# Patient Record
Sex: Female | Born: 1975 | Hispanic: No | Marital: Married | Smoking: Never smoker
Health system: Southern US, Community
[De-identification: ages and names within clinical notes are randomized; demographics above are authoritative.]

## PROBLEM LIST (undated history)

## (undated) DIAGNOSIS — Z9889 Other specified postprocedural states: Secondary | ICD-10-CM

## (undated) DIAGNOSIS — F909 Attention-deficit hyperactivity disorder, unspecified type: Secondary | ICD-10-CM

## (undated) DIAGNOSIS — R112 Nausea with vomiting, unspecified: Secondary | ICD-10-CM

## (undated) DIAGNOSIS — R519 Headache, unspecified: Secondary | ICD-10-CM

## (undated) DIAGNOSIS — K219 Gastro-esophageal reflux disease without esophagitis: Secondary | ICD-10-CM

---

## 1998-01-22 HISTORY — PX: URETHRAL STRICTURE DILATATION: SHX477

## 2006-12-04 ENCOUNTER — Other Ambulatory Visit: Admission: RE | Admit: 2006-12-04 | Discharge: 2006-12-04 | Payer: Self-pay | Admitting: Gynecology

## 2007-05-14 ENCOUNTER — Other Ambulatory Visit: Admission: RE | Admit: 2007-05-14 | Discharge: 2007-05-14 | Payer: Self-pay | Admitting: Gynecology

## 2007-08-12 ENCOUNTER — Other Ambulatory Visit: Admission: RE | Admit: 2007-08-12 | Discharge: 2007-08-12 | Payer: Self-pay | Admitting: Gynecology

## 2010-02-06 ENCOUNTER — Other Ambulatory Visit (HOSPITAL_COMMUNITY)
Admission: RE | Admit: 2010-02-06 | Discharge: 2010-02-06 | Disposition: A | Discharge status: Home / Self Care | Payer: PRIVATE HEALTH INSURANCE | Source: Ambulatory Visit | Attending: Internal Medicine | Admitting: Internal Medicine

## 2010-02-06 ENCOUNTER — Other Ambulatory Visit (HOSPITAL_COMMUNITY): Admission: RE | Admit: 2010-02-06 | Payer: Self-pay | Source: Ambulatory Visit | Admitting: Internal Medicine

## 2010-02-06 ENCOUNTER — Other Ambulatory Visit
Admission: RE | Admit: 2010-02-06 | Discharge: 2010-02-06 | Payer: Self-pay | Source: Home / Self Care | Admitting: Internal Medicine

## 2010-02-06 DIAGNOSIS — R87810 Cervical high risk human papillomavirus (HPV) DNA test positive: Secondary | ICD-10-CM | POA: Insufficient documentation

## 2012-06-23 ENCOUNTER — Other Ambulatory Visit (HOSPITAL_COMMUNITY)
Admission: RE | Admit: 2012-06-23 | Discharge: 2012-06-23 | Disposition: A | Discharge status: Home / Self Care | Payer: BC Managed Care – PPO | Source: Ambulatory Visit | Attending: Internal Medicine | Admitting: Internal Medicine

## 2012-06-23 DIAGNOSIS — Z01419 Encounter for gynecological examination (general) (routine) without abnormal findings: Secondary | ICD-10-CM | POA: Insufficient documentation

## 2012-06-23 DIAGNOSIS — R87810 Cervical high risk human papillomavirus (HPV) DNA test positive: Secondary | ICD-10-CM | POA: Insufficient documentation

## 2012-06-23 DIAGNOSIS — Z1151 Encounter for screening for human papillomavirus (HPV): Secondary | ICD-10-CM | POA: Insufficient documentation

## 2013-06-29 ENCOUNTER — Other Ambulatory Visit (HOSPITAL_COMMUNITY)
Admission: RE | Admit: 2013-06-29 | Discharge: 2013-06-29 | Disposition: A | Discharge status: Home / Self Care | Payer: BC Managed Care – PPO | Source: Ambulatory Visit | Attending: Internal Medicine | Admitting: Internal Medicine

## 2013-06-29 DIAGNOSIS — Z01419 Encounter for gynecological examination (general) (routine) without abnormal findings: Secondary | ICD-10-CM | POA: Insufficient documentation

## 2014-09-14 ENCOUNTER — Other Ambulatory Visit (HOSPITAL_COMMUNITY)
Admission: RE | Admit: 2014-09-14 | Discharge: 2014-09-14 | Disposition: A | Discharge status: Home / Self Care | Payer: BC Managed Care – PPO | Source: Ambulatory Visit | Attending: Internal Medicine | Admitting: Internal Medicine

## 2014-09-14 DIAGNOSIS — Z01419 Encounter for gynecological examination (general) (routine) without abnormal findings: Secondary | ICD-10-CM | POA: Insufficient documentation

## 2015-11-19 ENCOUNTER — Telehealth: Payer: PRIVATE HEALTH INSURANCE | Admitting: Nurse Practitioner

## 2015-11-19 DIAGNOSIS — J0101 Acute recurrent maxillary sinusitis: Secondary | ICD-10-CM

## 2015-11-19 MED ORDER — CIPROFLOXACIN HCL 500 MG PO TABS: 500.0000 mg | ORAL_TABLET | Freq: Two times a day (BID) | ORAL | 0 refills | Status: DC

## 2015-11-19 NOTE — Progress Notes (Signed)
We are sorry that you are not feeling well.  Here is how we plan to help!  Based on what you have shared with me it looks like you have sinusitis.  Sinusitis is inflammation and infection in the sinus cavities of the head.  Based on your presentation I believe you most likely have Acute Bacterial Sinusitis.  This is an infection caused by bacteria and is treated with antibiotics. I have prescribed cipro 500mg  1 po 2x a day. You may use an oral decongestant such as Mucinex D or if you have glaucoma or high blood pressure use plain Mucinex. Saline nasal spray help and can safely be used as often as needed for congestion.  If you develop worsening sinus pain, fever or notice severe headache and vision changes, or if symptoms are not better after completion of antibiotic, please schedule an appointment with a health care provider.    Sinus infections are not as easily transmitted as other respiratory infection, however we still recommend that you avoid close contact with loved ones, especially the very young and elderly.  Remember to wash your hands thoroughly throughout the day as this is the number one way to prevent the spread of infection!  Home Care:  Only take medications as instructed by your medical team.  Complete the entire course of an antibiotic.  Do not take these medications with alcohol.  A steam or ultrasonic humidifier can help congestion.  You can place a towel over your head and breathe in the steam from hot water coming from a faucet.  Avoid close contacts especially the very young and the elderly.  Cover your mouth when you cough or sneeze.  Always remember to wash your hands.  Get Help Right Away If:  You develop worsening fever or sinus pain.  You develop a severe head ache or visual changes.  Your symptoms persist after you have completed your treatment plan.  Make sure you  Understand these instructions.  Will watch your condition.  Will get help right away if  you are not doing well or get worse.  Your e-visit answers were reviewed by a board certified advanced clinical practitioner to complete your personal care plan.  Depending on the condition, your plan could have included both over the counter or prescription medications.  If there is a problem please reply  once you have received a response from your provider.  Your safety is important to us.  If you have drug allergies check your prescription carefully.    You can use MyChart to ask questions about today's visit, request a non-urgent call back, or ask for a work or school excuse for 24 hours related to this e-Visit. If it has been greater than 24 hours you will need to follow up with your provider, or enter a new e-Visit to address those concerns.  You will get an e-mail in the next two days asking about your experience.  I hope that your e-visit has been valuable and will speed your recovery. Thank you for using e-visits.

## 2016-02-25 ENCOUNTER — Telehealth: Payer: PRIVATE HEALTH INSURANCE | Admitting: Nurse Practitioner

## 2016-02-25 DIAGNOSIS — J111 Influenza due to unidentified influenza virus with other respiratory manifestations: Secondary | ICD-10-CM

## 2016-02-25 MED ORDER — OSELTAMIVIR PHOSPHATE 75 MG PO CAPS: 75.0000 mg | ORAL_CAPSULE | Freq: Two times a day (BID) | ORAL | 0 refills | Status: DC

## 2016-02-25 NOTE — Progress Notes (Signed)

## 2016-06-28 ENCOUNTER — Telehealth: Payer: PRIVATE HEALTH INSURANCE | Admitting: Nurse Practitioner

## 2016-06-28 DIAGNOSIS — J019 Acute sinusitis, unspecified: Secondary | ICD-10-CM

## 2016-06-28 MED ORDER — FLUTICASONE PROPIONATE 50 MCG/ACT NA SUSP: 2.0000 | Freq: Every day | NASAL | 0 refills | Status: DC

## 2016-06-28 MED ORDER — DOXYCYCLINE HYCLATE 100 MG PO TABS: 100.0000 mg | ORAL_TABLET | Freq: Two times a day (BID) | ORAL | 0 refills | Status: AC

## 2016-06-28 NOTE — Progress Notes (Signed)
We are sorry that you are not feeling well.  Here is how we plan to help!  Based on what you have shared with me it looks like you have sinusitis.  Sinusitis is inflammation and infection in the sinus cavities of the head.  Based on your presentation I believe you most likely have Acute Bacterial Sinusitis.This is an infection most likely caused by a bacteria.  You may use an oral decongestant such as Mucinex D or if you have glaucoma or high blood pressure use plain Mucinex. Saline nasal spray help and can safely be used as often as needed for congestion, I have prescribed: Fluticasone nasal spray two sprays in each nostril twice a day for ten days and Doxycycline 100mg  twice daily for 10 days.  Some authorities believe that zinc sprays or the use of Echinacea may shorten the course of your symptoms.  Sinus infections are not as easily transmitted as other respiratory infection, however we still recommend that you avoid close contact with loved ones, especially the very young and elderly.  Remember to wash your hands thoroughly throughout the day as this is the number one way to prevent the spread of infection!  Home Care:  Only take medications as instructed by your medical team.  Complete the entire course of an antibiotic.  Do not take these medications with alcohol.  A steam or ultrasonic humidifier can help congestion.  You can place a towel over your head and breathe in the steam from hot water coming from a faucet.  Avoid close contacts especially the very young and the elderly.  Cover your mouth when you cough or sneeze.  Always remember to wash your hands.  Get Help Right Away If:  You develop worsening fever or sinus pain.  You develop a severe head ache or visual changes.  Your symptoms persist after you have completed your treatment plan.  Make sure you  Understand these instructions.  Will watch your condition.  Will get help right away if you are not doing well or  get worse.  Your e-visit answers were reviewed by a board certified advanced clinical practitioner to complete your personal care plan.  Depending on the condition, your plan could have included both over the counter or prescription medications.  If there is a problem please reply  once you have received a response from your provider.  Your safety is important to us.  If you have drug allergies check your prescription carefully.    You can use MyChart to ask questions about today's visit, request a non-urgent call back, or ask for a work or school excuse for 24 hours related to this e-Visit. If it has been greater than 24 hours you will need to follow up with your provider, or enter a new e-Visit to address those concerns.  You will get an e-mail in the next two days asking about your experience.  I hope that your e-visit has been valuable and will speed your recovery. Thank you for using e-visits.

## 2016-08-31 ENCOUNTER — Telehealth: Payer: PRIVATE HEALTH INSURANCE | Admitting: Family

## 2016-08-31 DIAGNOSIS — J028 Acute pharyngitis due to other specified organisms: Secondary | ICD-10-CM

## 2016-08-31 DIAGNOSIS — B9689 Other specified bacterial agents as the cause of diseases classified elsewhere: Secondary | ICD-10-CM

## 2016-08-31 MED ORDER — BENZONATATE 100 MG PO CAPS: 100.0000 mg | ORAL_CAPSULE | Freq: Three times a day (TID) | ORAL | 0 refills | Status: DC | PRN

## 2016-08-31 MED ORDER — AZITHROMYCIN 250 MG PO TABS: ORAL_TABLET | ORAL | 0 refills | Status: DC

## 2016-08-31 NOTE — Progress Notes (Signed)
Thank you for the details you put in the comment boxes. Those details really help us take better care of you. Please stop using the nasal flush and consider switching to saline nasal spray to avoid worsening your infection with the flush.  We are sorry that you are not feeling well.  Here is how we plan to help!  Based on your presentation I believe you most likely have A cough due to bacteria.  When patients have a fever and a productive cough with a change in color or increased sputum production, we are concerned about bacterial bronchitis.  If left untreated it can progress to pneumonia.  If your symptoms do not improve with your treatment plan it is important that you contact your provider.   I have prescribed Azithromyin 250 mg: two tables now and then one tablet daily for 4 additonal days    In addition you may use A non-prescription cough medication called Mucinex DM: take 2 tablets every 12 hours. and A prescription cough medication called Tessalon Perles 100mg . You may take 1-2 capsules every 8 hours as needed for your cough.   From your responses in the eVisit questionnaire you describe inflammation in the upper respiratory tract which is causing a significant cough.  This is commonly called Bronchitis and has four common causes:    Allergies  Viral Infections  Acid Reflux  Bacterial Infection Allergies, viruses and acid reflux are treated by controlling symptoms or eliminating the cause. An example might be a cough caused by taking certain blood pressure medications. You stop the cough by changing the medication. Another example might be a cough caused by acid reflux. Controlling the reflux helps control the cough.  USE OF BRONCHODILATOR ("RESCUE") INHALERS: There is a risk from using your bronchodilator too frequently.  The risk is that over-reliance on a medication which only relaxes the muscles surrounding the breathing tubes can reduce the effectiveness of medications prescribed  to reduce swelling and congestion of the tubes themselves.  Although you feel brief relief from the bronchodilator inhaler, your asthma may actually be worsening with the tubes becoming more swollen and filled with mucus.  This can delay other crucial treatments, such as oral steroid medications. If you need to use a bronchodilator inhaler daily, several times per day, you should discuss this with your provider.  There are probably better treatments that could be used to keep your asthma under control.     HOME CARE . Only take medications as instructed by your medical team. . Complete the entire course of an antibiotic. . Drink plenty of fluids and get plenty of rest. . Avoid close contacts especially the very young and the elderly . Cover your mouth if you cough or cough into your sleeve. . Always remember to wash your hands . A steam or ultrasonic humidifier can help congestion.   GET HELP RIGHT AWAY IF: . You develop worsening fever. . You become short of breath . You cough up blood. . Your symptoms persist after you have completed your treatment plan MAKE SURE YOU   Understand these instructions.  Will watch your condition.  Will get help right away if you are not doing well or get worse.  Your e-visit answers were reviewed by a board certified advanced clinical practitioner to complete your personal care plan.  Depending on the condition, your plan could have included both over the counter or prescription medications. If there is a problem please reply  once you have received a  response from your provider. Your safety is important to [place].  If you have drug allergies check your prescription carefully.    You can use MyChart to ask questions about today's visit, request a non-urgent call back, or ask for a work or school excuse for 24 hours related to this e-Visit. If it has been greater than 24 hours you will need to follow up with your provider, or enter a new e-Visit to address  those concerns. You will get an e-mail in the next two days asking about your experience.  I hope that your e-visit has been valuable and will speed your recovery. Thank you for using e-visits.

## 2016-10-10 ENCOUNTER — Telehealth: Payer: PRIVATE HEALTH INSURANCE | Admitting: Family

## 2016-10-10 DIAGNOSIS — R399 Unspecified symptoms and signs involving the genitourinary system: Secondary | ICD-10-CM

## 2016-10-10 MED ORDER — NITROFURANTOIN MONOHYD MACRO 100 MG PO CAPS: 100.0000 mg | ORAL_CAPSULE | Freq: Two times a day (BID) | ORAL | 0 refills | Status: DC

## 2016-10-10 NOTE — Progress Notes (Signed)

## 2017-01-23 ENCOUNTER — Telehealth: Payer: PRIVATE HEALTH INSURANCE | Admitting: Family

## 2017-01-23 DIAGNOSIS — J019 Acute sinusitis, unspecified: Secondary | ICD-10-CM

## 2017-01-23 MED ORDER — AMOXICILLIN-POT CLAVULANATE 875-125 MG PO TABS: 1.0000 | ORAL_TABLET | Freq: Two times a day (BID) | ORAL | 0 refills | Status: DC

## 2017-01-23 NOTE — Progress Notes (Signed)

## 2017-06-14 ENCOUNTER — Telehealth: Payer: PRIVATE HEALTH INSURANCE | Admitting: Family Medicine

## 2017-06-14 DIAGNOSIS — J329 Chronic sinusitis, unspecified: Secondary | ICD-10-CM

## 2017-06-14 MED ORDER — AMOXICILLIN-POT CLAVULANATE 875-125 MG PO TABS: 1.0000 | ORAL_TABLET | Freq: Two times a day (BID) | ORAL | 0 refills | Status: AC

## 2017-06-14 NOTE — Progress Notes (Signed)

## 2017-10-25 ENCOUNTER — Telehealth: Payer: PRIVATE HEALTH INSURANCE | Admitting: Family

## 2017-10-25 DIAGNOSIS — N390 Urinary tract infection, site not specified: Secondary | ICD-10-CM

## 2017-10-25 MED ORDER — CEPHALEXIN 500 MG PO CAPS: 500.0000 mg | ORAL_CAPSULE | Freq: Two times a day (BID) | ORAL | 0 refills | Status: DC

## 2017-10-25 MED ORDER — FLUCONAZOLE 150 MG PO TABS: 150.0000 mg | ORAL_TABLET | Freq: Once | ORAL | 0 refills | Status: AC

## 2017-10-25 NOTE — Progress Notes (Signed)

## 2017-11-29 ENCOUNTER — Ambulatory Visit: Payer: BC Managed Care – PPO | Admitting: Pulmonary Disease

## 2017-11-29 ENCOUNTER — Encounter: Payer: Self-pay | Admitting: Pulmonary Disease

## 2017-11-29 VITALS — BP 112/78 | HR 78

## 2017-11-29 DIAGNOSIS — R0602 Shortness of breath: Secondary | ICD-10-CM | POA: Diagnosis not present

## 2017-11-29 LAB — NITRIC OXIDE

## 2017-11-29 MED ORDER — ALBUTEROL SULFATE HFA 108 (90 BASE) MCG/ACT IN AERS: 2.0000 | INHALATION_SPRAY | Freq: Four times a day (QID) | RESPIRATORY_TRACT | 6 refills | Status: DC | PRN

## 2017-11-29 NOTE — Patient Instructions (Signed)
Shortness of breath and coughing Possible exercise-induced asthma  Moderate probability of significant sleep disordered breathing  We will set you up for home sleep study  Albuterol 2 puffs q. 15 to 30 minutes prior to activity  Continue medications for reflux Behavioral modifications to help reflux symptoms  I will see you back in the office in about 3 months

## 2017-11-29 NOTE — Progress Notes (Signed)
Katherine [name]    [id number]    1975/07/31  Primary Care Physician:Pharr, Zollie Beckers, MD  Referring Physician: Merri Brunette, MD [address] [place], [place] [address]  Chief complaint:  History of daytime sleepiness, snoring  HPI:  Patient with a history of daytime sleepiness, snoring Over 50 pound weight gain in the last couple years Shortness of breath with activity  She gets short of breath with some activity Occasionally able to go to activity without shortness of breath She does have a history of occasional wheezes  History of reflux History of cough which is worse at night   Outpatient Encounter Medications as of 11/29/2017  Medication Sig  . cetirizine (ZYRTEC ALLERGY) 10 MG tablet Take 10 mg by mouth daily.  . drospirenone-ethinyl estradiol (YASMIN,ZARAH,SYEDA) 3-0.03 MG tablet TAKE 1 TABLET BY MOUTH EVERY DAY  . esomeprazole (NEXIUM) 20 MG capsule Take 20 mg by mouth daily at 12 noon.  . fluticasone (FLONASE) 50 MCG/ACT nasal spray Place 2 sprays into both nostrils daily.  . meloxicam (MOBIC) 7.5 MG tablet meloxicam 7.5 mg tablet  . Multiple Vitamins-Minerals (MULTIVITAMIN WITH MINERALS) tablet Take 1 tablet by mouth daily.  . rizatriptan (MAXALT) 5 MG tablet rizatriptan 5 mg tablet  . venlafaxine XR (EFFEXOR-XR) 75 MG 24 hr capsule venlafaxine ER 75 mg capsule,extended release 24 hr  . [DISCONTINUED] benzonatate (TESSALON PERLES) 100 MG capsule Take 1-2 capsules (100-200 mg total) by mouth every 8 (eight) hours as needed for cough.  . [DISCONTINUED] cephALEXin (KEFLEX) 500 MG capsule Take 1 capsule (500 mg total) by mouth 2 (two) times daily.  . [DISCONTINUED] oseltamivir (TAMIFLU) 75 MG capsule Take 1 capsule (75 mg total) by mouth 2 (two) times daily.   No facility-administered encounter medications on file as of 11/29/2017.     Allergies as of 11/29/2017  . (No Known Allergies)    No past medical history on file.    No family  history on file.  Social History   Socioeconomic History  . Marital status: Unknown    Spouse name: Not on file  . Number of children: Not on file  . Years of education: Not on file  . Highest education level: Not on file  Occupational History  . Not on file  Social Needs  . Financial resource strain: Not on file  . Food insecurity:    Worry: Not on file    Inability: Not on file  . Transportation needs:    Medical: Not on file    Non-medical: Not on file  Tobacco Use  . Smoking status: Never Smoker  . Smokeless tobacco: Never Used  Substance and Sexual Activity  . Alcohol use: Not on file  . Drug use: Not on file  . Sexual activity: Not on file  Lifestyle  . Physical activity:    Days per week: Not on file    Minutes per session: Not on file  . Stress: Not on file  Relationships  . Social connections:    Talks on phone: Not on file    Gets together: Not on file    Attends religious service: Not on file    Active member of club or organization: Not on file    Attends meetings of clubs or organizations: Not on file    Relationship status: Not on file  . Intimate partner violence:    Fear of current or ex partner: Not on file    Emotionally abused: Not  on file    Physically abused: Not on file    Forced sexual activity: Not on file  Other Topics Concern  . Not on file  Social History Narrative  . Not on file    Review of Systems  HENT: Negative.   Eyes: Negative.   Respiratory: Positive for shortness of breath and wheezing.   Cardiovascular: Negative.   Gastrointestinal: Negative.   Endocrine: Negative.   Psychiatric/Behavioral: Positive for sleep disturbance.  All other systems reviewed and are negative.   Vitals:   11/29/17 1516  BP: 112/78  Pulse: 78  SpO2: 98%     Physical Exam  Constitutional: She is oriented to person, place, and time. She appears well-developed and well-nourished.  HENT:  Head: Normocephalic.  Eyes: Pupils are equal,  round, and reactive to light. Conjunctivae and EOM are normal. Right eye exhibits no discharge. Left eye exhibits no discharge.  Neck: Normal range of motion. Neck supple. No tracheal deviation present. No thyromegaly present.  Cardiovascular: Normal rate and regular rhythm.  Pulmonary/Chest: Effort normal and breath sounds normal. No respiratory distress. She has no wheezes.  Abdominal: Soft. Bowel sounds are normal. She exhibits no distension. There is no tenderness.  Musculoskeletal: Normal range of motion. She exhibits no edema or deformity.  Neurological: She is alert and oriented to person, place, and time. No cranial nerve deficit.  Skin: Skin is warm and dry. She is not diaphoretic. No erythema.  Psychiatric: She has a normal mood and affect.   ESS of 7  Assessment:  Moderate to high probability of significant sleep disordered breathing -Snoring, nonrestorative sleep, daytime sleepiness Shortness of breath -Possible exercise-induced asthma, however, significant variability in activity causes shortness of breath  Cough which may be associated with reflux -Continue Nexium  Obesity -Weight loss efforts commended and discussed  Plan/Recommendations:  Pathophysiology of sleep disordered breathing discussed with the patient  Options of treatment for sleep disordered breathing discussed  Prescription for albuterol was provided  Continue to optimize treatment for reflux   We will schedule the patient for a home sleep study     Virl Diamond MD [facility] 11/29/2017, 3:51 PM  CC: Merri Brunette, MD

## 2018-01-05 ENCOUNTER — Telehealth: Payer: PRIVATE HEALTH INSURANCE | Admitting: Family

## 2018-01-05 DIAGNOSIS — J029 Acute pharyngitis, unspecified: Secondary | ICD-10-CM

## 2018-01-05 MED ORDER — AMOXICILLIN 500 MG PO CAPS: 500.0000 mg | ORAL_CAPSULE | Freq: Two times a day (BID) | ORAL | 0 refills | Status: DC

## 2018-01-05 NOTE — Progress Notes (Signed)

## 2018-01-24 ENCOUNTER — Telehealth: Payer: Self-pay | Admitting: Pulmonary Disease

## 2018-01-24 NOTE — Telephone Encounter (Signed)
Will forward to AO so he is aware.

## 2018-01-24 NOTE — Telephone Encounter (Signed)
Spoke with pt. I clarified the message [facility] sent to triage. Pt states that she is doing well and has lost some weight and her snoring as improved per her husband. Advised pt that I would let Dr. Wynona Neat know this and to call us if she changes her mind about wanting an appointment. She agreed and verbalized understanding.

## 2018-01-24 NOTE — Telephone Encounter (Signed)
Let us reach out to the patient to schedule them for follow-up appointment If they desire to be followed up

## 2018-01-24 NOTE — Telephone Encounter (Signed)
LMTCB

## 2018-01-24 NOTE — Telephone Encounter (Signed)
Patient returned call.  States she did not do sleep study and does not think she needs follow up.  States once she was started on medicine for asthma, albuterol, she has been able to exercise, she is losing weight, and her husband says her snoring has reduced.  CB is [phone].

## 2018-01-31 ENCOUNTER — Telehealth: Payer: PRIVATE HEALTH INSURANCE | Admitting: Family

## 2018-01-31 DIAGNOSIS — J329 Chronic sinusitis, unspecified: Secondary | ICD-10-CM

## 2018-01-31 DIAGNOSIS — B9689 Other specified bacterial agents as the cause of diseases classified elsewhere: Secondary | ICD-10-CM | POA: Diagnosis not present

## 2018-01-31 MED ORDER — AMOXICILLIN-POT CLAVULANATE 875-125 MG PO TABS: 1.0000 | ORAL_TABLET | Freq: Two times a day (BID) | ORAL | 0 refills | Status: AC

## 2018-01-31 NOTE — Progress Notes (Signed)

## 2018-10-05 ENCOUNTER — Other Ambulatory Visit: Payer: Self-pay | Admitting: Pulmonary Disease

## 2018-10-05 DIAGNOSIS — R0602 Shortness of breath: Secondary | ICD-10-CM

## 2018-12-02 ENCOUNTER — Other Ambulatory Visit: Payer: Self-pay | Admitting: Pulmonary Disease

## 2018-12-02 DIAGNOSIS — R0602 Shortness of breath: Secondary | ICD-10-CM

## 2019-10-11 ENCOUNTER — Telehealth: Payer: BC Managed Care – PPO | Admitting: Family

## 2019-10-11 ENCOUNTER — Encounter: Payer: Self-pay | Admitting: Family

## 2019-10-11 ENCOUNTER — Other Ambulatory Visit: Payer: Self-pay | Admitting: Family

## 2019-10-11 DIAGNOSIS — J019 Acute sinusitis, unspecified: Secondary | ICD-10-CM

## 2019-10-11 MED ORDER — AMOXICILLIN-POT CLAVULANATE 875-125 MG PO TABS: 1.0000 | ORAL_TABLET | Freq: Two times a day (BID) | ORAL | 0 refills | Status: DC

## 2019-10-11 MED ORDER — DOXYCYCLINE HYCLATE 100 MG PO TABS: 100.0000 mg | ORAL_TABLET | Freq: Two times a day (BID) | ORAL | 0 refills | Status: DC

## 2019-10-11 NOTE — Progress Notes (Signed)
We are sorry for the delay in getting back to you. There was a glitch in the system and we are just now seeing the messages from earlier today.  We are sorry that you are not feeling well.  Here is how we plan to help!  Based on what you have shared with me it looks like you have sinusitis.  Sinusitis is inflammation and infection in the sinus cavities of the head.  Based on your presentation I believe you most likely have Acute Bacterial Sinusitis.  This is an infection caused by bacteria and is treated with antibiotics. I have prescribed Augmentin 875mg /125mg  one tablet twice daily with food, for 7 days. You may use an oral decongestant such as Mucinex D or if you have glaucoma or high blood pressure use plain Mucinex. Saline nasal spray help and can safely be used as often as needed for congestion.  If you develop worsening sinus pain, fever or notice severe headache and vision changes, or if symptoms are not better after completion of antibiotic, please schedule an appointment with a health care provider.    Sinus infections are not as easily transmitted as other respiratory infection, however we still recommend that you avoid close contact with loved ones, especially the very young and elderly.  Remember to wash your hands thoroughly throughout the day as this is the number one way to prevent the spread of infection!  Home Care:  Only take medications as instructed by your medical team.  Complete the entire course of an antibiotic.  Do not take these medications with alcohol.  A steam or ultrasonic humidifier can help congestion.  You can place a towel over your head and breathe in the steam from hot water coming from a faucet.  Avoid close contacts especially the very young and the elderly.  Cover your mouth when you cough or sneeze.  Always remember to wash your hands.  Get Help Right Away If:  You develop worsening fever or sinus pain.  You develop a severe head ache or visual  changes.  Your symptoms persist after you have completed your treatment plan.  Make sure you  Understand these instructions.  Will watch your condition.  Will get help right away if you are not doing well or get worse.  Your e-visit answers were reviewed by a board certified advanced clinical practitioner to complete your personal care plan.  Depending on the condition, your plan could have included both over the counter or prescription medications.  If there is a problem please reply  once you have received a response from your provider.  Your safety is important to .  If you have drug allergies check your prescription carefully.    You can use MyChart to ask questions about today's visit, request a non-urgent call back, or ask for a work or school excuse for 24 hours related to this e-Visit. If it has been greater than 24 hours you will need to follow up with your provider, or enter a new e-Visit to address those concerns.  You will get an e-mail in the next two days asking about your experience.  I hope that your e-visit has been valuable and will speed your recovery. Thank you for using e-visits.  Greater than 5 minutes, yet less than 10 minutes of time have been spent researching, coordinating, and implementing care for this patient.

## 2020-03-05 ENCOUNTER — Telehealth: Payer: BC Managed Care – PPO | Admitting: Nurse Practitioner

## 2020-03-05 DIAGNOSIS — J0101 Acute recurrent maxillary sinusitis: Secondary | ICD-10-CM

## 2020-03-05 MED ORDER — DOXYCYCLINE HYCLATE 100 MG PO TABS: 100.0000 mg | ORAL_TABLET | Freq: Two times a day (BID) | ORAL | 0 refills | Status: DC

## 2020-03-05 NOTE — Progress Notes (Signed)

## 2020-08-29 ENCOUNTER — Ambulatory Visit: Payer: BC Managed Care – PPO | Admitting: Neurology

## 2020-08-29 ENCOUNTER — Encounter: Payer: Self-pay | Admitting: Neurology

## 2020-08-29 VITALS — BP 133/86 | HR 81 | Ht 63.0 in | Wt 270.0 lb

## 2020-08-29 DIAGNOSIS — G44019 Episodic cluster headache, not intractable: Secondary | ICD-10-CM

## 2020-08-29 DIAGNOSIS — G43109 Migraine with aura, not intractable, without status migrainosus: Secondary | ICD-10-CM

## 2020-08-29 DIAGNOSIS — R0683 Snoring: Secondary | ICD-10-CM

## 2020-08-29 DIAGNOSIS — M542 Cervicalgia: Secondary | ICD-10-CM

## 2020-08-29 DIAGNOSIS — M2619 Other specified anomalies of jaw-cranial base relationship: Secondary | ICD-10-CM | POA: Diagnosis not present

## 2020-08-29 DIAGNOSIS — H53141 Visual discomfort, right eye: Secondary | ICD-10-CM

## 2020-08-29 DIAGNOSIS — R519 Headache, unspecified: Secondary | ICD-10-CM | POA: Diagnosis not present

## 2020-08-29 DIAGNOSIS — M5481 Occipital neuralgia: Secondary | ICD-10-CM

## 2020-08-29 NOTE — Patient Instructions (Signed)
Sleep Apnea Sleep apnea is a condition in which breathing pauses or becomes shallow during sleep. People with sleep apnea usually snore loudly. They may have times when they gasp and stop breathing for 10 seconds or more during sleep. This mayhappen many times during the night. Sleep apnea disrupts your sleep and keeps your body from getting the rest that it needs. This condition can increase your risk of certain health problems, including: Heart attack. Stroke. Obesity. Type 2 diabetes. Heart failure. Irregular heartbeat. High blood pressure. The goal of treatment is to help you breathe normally again. What are the causes? The most common cause of sleep apnea is a collapsed or blocked airway. There are three kinds of sleep apnea: Obstructive sleep apnea. This kind is caused by a blocked or collapsed airway. Central sleep apnea. This kind happens when the part of the brain that controls breathing does not send the correct signals to the muscles that control breathing. Mixed sleep apnea. This is a combination of obstructive and central sleep apnea. What increases the risk? You are more likely to develop this condition if you: Are overweight. Smoke. Have a smaller than normal airway. Are older. Are female. Drink alcohol. Take sedatives or tranquilizers. Have a family history of sleep apnea. Have a tongue or tonsils that are larger than normal. What are the signs or symptoms? Symptoms of this condition include: Trouble staying asleep. Loud snoring. Morning headaches. Waking up gasping. Dry mouth or sore throat in the morning. Daytime sleepiness and tiredness. If you have daytime fatigue because of sleep apnea, you may be more likely to have: Trouble concentrating. Forgetfulness. Irritability or mood swings. Personality changes. Feelings of depression. Sexual dysfunction. This may include loss of interest if you are female, or erectile dysfunction if you are female. How is this  diagnosed? This condition may be diagnosed with: A medical history. A physical exam. A series of tests that are done while you are sleeping (sleep study). These tests are usually done in a sleep lab, but they may also be done at home. How is this treated? Treatment for this condition aims to restore normal breathing and to ease symptoms during sleep. It may involve managing health issues that can affect breathing, such as high blood pressure or obesity. Treatment may include: Sleeping on your side. Using a decongestant if you have nasal congestion. Avoiding the use of depressants, including alcohol, sedatives, and narcotics. Losing weight if you are overweight. Making changes to your diet. Quitting smoking. Using a device to open your airway while you sleep, such as: An oral appliance. This is a custom-made mouthpiece that shifts your lower jaw forward. A continuous positive airway pressure (CPAP) device. This device blows air through a mask when you breathe out (exhale). A nasal expiratory positive airway pressure (EPAP) device. This device has valves that you put into each nostril. A bi-level positive airway pressure (BPAP) device. This device blows air through a mask when you breathe in (inhale) and breathe out (exhale). Having surgery if other treatments do not work. During surgery, excess tissue is removed to create a wider airway. Follow these instructions at home: Lifestyle Make any lifestyle changes that your health care provider recommends. Eat a healthy, well-balanced diet. Take steps to lose weight if you are overweight. Avoid using depressants, including alcohol, sedatives, and narcotics. Do not use any products that contain nicotine or tobacco. These products include cigarettes, chewing tobacco, and vaping devices, such as e-cigarettes. If you need help quitting, ask your health   care provider. General instructions Take over-the-counter and prescription medicines only as told  by your health care provider. If you were given a device to open your airway while you sleep, use it only as told by your health care provider. If you are having surgery, make sure to tell your health care provider you have sleep apnea. You may need to bring your device with you. Keep all follow-up visits. This is important. Contact a health care provider if: The device that you received to open your airway during sleep is uncomfortable or does not seem to be working. Your symptoms do not improve. Your symptoms get worse. Get help right away if: You develop: Chest pain. Shortness of breath. Discomfort in your back, arms, or stomach. You have: Trouble speaking. Weakness on one side of your body. Drooping in your face. These symptoms may represent a serious problem that is an emergency. Do not wait to see if the symptoms will go away. Get medical help right away. Call your local emergency services (911 in the [place] Do not drive yourself to the hospital. Summary Sleep apnea is a condition in which breathing pauses or becomes shallow during sleep. The most common cause is a collapsed or blocked airway. The goal of treatment is to restore normal breathing and to ease symptoms during sleep. This information is not intended to replace advice given to you by your health care provider. Make sure you discuss any questions you have with your healthcare provider. Document Revised: 12/18/2019 Document Reviewed: 12/18/2019 Elsevier Patient Education  2022 Else[employer] Inc. Occipital Neuralgia  Occipital neuralgia is a type of headache that causes brief episodes of very bad pain in the back of the head. Pain from occipital neuralgia may spread (radiate) to other parts of the head. These headaches may be caused by irritation of the nerves that leave the spinal cord high up in the neck, just below the base of the skull (occipital nerves). The occipital nerves transmit sensations from the back of the head,  the topof the head, and the areas behind the ears. What are the causes? This condition can occur without any known cause (primary headache syndrome). In other cases, this condition is caused by pressure on or irritation of one of the two occipital nerves. Pressure and irritation may be due to: Muscle spasm in the neck. Neck injury. Wear and tear of the vertebrae in the neck (osteoarthritis). Disease of the disks that separate the vertebrae. Swollen blood vessels that put pressure on the occipital nerves. Infections. Tumors. Diabetes. What are the signs or symptoms? This condition causes brief burning, stabbing, electric, shocking, or shooting pain in the back of the head that can radiate to the top of the head. It can happen on one side or both sides of the head. It can also cause: Pain behind the eye. Pain triggered by neck movement or hair brushing. Scalp tenderness. Aching in the back of the head between episodes of very bad pain. Pain that gets worse with exposure to bright lights. How is this diagnosed? Your health care provider may diagnose the condition based on a physical exam and your symptoms. Tests may be done, such as: Imaging studies of the brain and neck (cervical spine), such as an MRI or CT scan. These look for causes of pinched nerves. Applying pressure to the nerves in the neck to try to re-create the pain. Injection of numbing medicine into the occipital nerve areas to see if pain goes away (diagnostic nerve block). How  is this treated? Treatment for this condition may begin with simple measures, such as: Rest. Massage. Applying heat or cold to the area. Over-the-counter pain relievers. If these measures do not work, you may need other treatments, including: Medicines, such as: Prescription-strength anti-inflammatory medicines. Muscle relaxants. Anti-seizure medicines, which can relieve pain. Antidepressants, which can relieve pain. Injected medicines, such as  medicines that numb the area (local anesthetic) and steroids. Pulsed radiofrequency ablation. This is when wires are implanted to deliver electrical impulses that block pain signals from the occipital nerve. Surgery to relieve nerve pressure. Physical therapy. Follow these instructions at home: Managing pain     Avoid any activities that cause pain. Rest when you have an attack of pain. Try gentle massage to relieve pain. Try a different pillow or sleeping position. If directed, apply heat to the affected area as often as told by your health care provider. Use the heat source that your health care provider recommends, such as a moist heat pack or a heating pad. Place a towel between your skin and the heat source. Leave the heat on for 20-30 minutes. Remove the heat if your skin turns bright red. This is especially important if you are unable to feel pain, heat, or cold. You have a greater risk of getting burned. If directed, put ice on the back of your head and neck area. To do this: Put ice in a plastic bag. Place a towel between your skin and the bag. Leave the ice on for 20 minutes, 2-3 times a day. Remove the ice if your skin turns bright red. This is very important. If you cannot feel pain, heat, or cold, you have a greater risk of damage to the area. General instructions Take over-the-counter and prescription medicines only as told by your health care provider. Avoid things that make your symptoms worse, such as bright lights. Try to stay active. Get regular exercise that does not cause pain. Ask your health care provider to suggest safe exercises for you. Work with a physical therapist to learn stretching exercises you can do at home. Practice good posture. Keep all follow-up visits. This is important. Contact a health care provider if: Your medicine is not working. You have new or worsening symptoms. Get help right away if: You have very bad head pain that does not go  away. You have a sudden change in vision, balance, or speech. These symptoms may represent a serious problem that is an emergency. Do not wait to see if the symptoms will go away. Get medical help right away. Call your local emergency services (911 in the [place] Do not drive yourself to the hospital. Summary Occipital neuralgia is a type of headache that causes brief episodes of very bad pain in the back of the head. Pain from occipital neuralgia may spread (radiate) to other parts of the head. Treatment for this condition includes rest, massage, and medicines. This information is not intended to replace advice given to you by your health care provider. Make sure you discuss any questions you have with your healthcare provider. Document Revised: 11/08/2019 Document Reviewed: 11/08/2019 Elsevier Patient Education  2022 [employer].

## 2020-08-29 NOTE — Progress Notes (Signed)
Provider:  Melvyn Novas, MD  Primary Care Physician:  Merri Brunette, MD [address] [place] [address]     Referring Provider: Merri Brunette, Md [address] [place],  [place] [address]          Chief Complaint according to patient   Patient presents with:     New Patient (Initial Visit)     Paper referral from Dr. Renne Crigler for migraine management,  pt reports having chronic migraines and HA, pt has degenerative cervical disease. Migraine have been going for over 10 years, and worsen last few years. Pt having 25 migraines a month. Currently on day 28 of a migraine. Pt has recently had a cervical x-ray on 7/7.  Neck pain and migraine go hand in hand.       HISTORY OF PRESENT ILLNESS: 08-29-2020 Katherine [name] is a 45 y.o. year old Caucasian female patient seen here upon a migraine based  referral on 08/29/2020 from Dr Renne Crigler- The patient is now here for sleep , has seen Dr Adelene Idler just last year. Never had a sleep study.     Chief concern according to patient :  Paper referral from Dr. Renne Crigler for migraine management,  pt reports having chronic migraines and morning HA,nausea with headaches , photophobia, phonophobia, olfactory sensitivity. The  pt reportedly has degenerative cervical disease.  Migraine have been going for over 14 years, after moving to [facility]- 14 years ago-  and worsened with stress at the job, now the same level last few years.  Pt having 25 migraines a month for the last 3 years- some with status migrainosus. Currently on day 28 of a migraine.  Pt has recently had a cervical x ray on 7/7. Emgality helped initially, now not so much anymore, never had Botox.    I have the pleasure of seeing  Katherine [name] today, a right-handed Katherine of [facility] at [facility].   The patient never had a sleep study.     Sleep relevant medical history: Nocturia 1-2 times each night, lucid nightmares in childhood, sleep talking,  Tonsillectomy, cervical spine DDD, per patient's report- nothing entered into EPIC.  Frequent sinusitis. She has a deviated septum nasi, has been scheduled for surgery. ENT surgeon is Dr Rosalio Macadamia.   [facility] medical Verne Spurr history: NO other family member on CPAP with OSA, insomnia, sleep walkers.    Social history: Patient is working as a Research scientist (life sciences),  and lives in a household with spouse and 2 dogs,  The patient currently works regular daytime. Tobacco [id number] .  ETOH use; causes headaches- none. ,  Caffeine intake in form of Coffee( 2 cups in AM ) Soda( /) Tea ( /) or energy drinks. Regular exercise: daily- yoga. Pilates,      Sleep habits are as follows: The patient's dinner time is between 6 PM. The patient goes to bed at 9.30  PM and continues to sleep for 3 hours, wakes for 1-2 bathroom breaks, the first time at 3 AM.   The preferred sleep position is supine , with a cervical support pillow. , with the support of 1 pillow, on an adjustable bed- . Dreams are reportedly rare.  AM headaches and also woken by headaches- cluster type- stabbing headaches.  7  AM is the usual rise time. The patient wakes up spontaneously. She reports not feeling refreshed or restored in AM, with symptoms such as dry mouth, morning headaches-frequently , and residual fatigue. Naps  are taken frequently, lasting from 15 to 45 minutes and are more refreshing than nocturnal sleep.    Review of Systems: Out of a complete 14 system review, the patient complains of only the following symptoms, and all other reviewed systems are negative.:  Fatigue, sleepiness , snoring, nocturia, GERD, fragmented sleep, headaches waking her and being present when she wakes.   Depression/ anxiety  Muscle pain, joint pain-    How likely are you to doze in the following situations: 0 = not likely, 1 = slight chance, 2 = moderate chance, 3 = high chance   Sitting and Reading? 1 Watching Television? 1 Sitting inactive in a public place  (theater or meeting)?0 As a passenger in a car for an hour without a break?0 Lying down in the afternoon when circumstances permit?3 Sitting and talking to someone?0 Sitting quietly after lunch without alcohol?3 In a car, while stopped for a few minutes in traffic?0   Total = 8 / 24 points   FSS endorsed at 40/ 63 points.   Social History   Socioeconomic History   Marital status: Married    Spouse name: andy   Number of children: Not on file   Years of education: Not on file   Highest education level: Professional school degree (e.g., MD, DDS, DVM, JD)  Occupational History   Not on file  Tobacco Use   Smoking status: Never   Smokeless tobacco: Never  Substance and Sexual Activity   Alcohol use: Never   Drug use: Never   Sexual activity: Not on file  Other Topics Concern   Not on file  Social History Narrative   Live with husband   Right handed   Caffeine: 2 cups of coffee a day, no sodas   Social Determinants of Health   Financial Resource Strain: Not on file  Food Insecurity: Not on file  Transportation Needs: Not on file  Physical Activity: Not on file  Stress: Not on file  Social Connections: Not on file    Family History  Problem Relation Age of Onset   Hypertension Mother    Diabetes Mother    Hypertension Father    Cancer Maternal Grandfather     History reviewed. No pertinent past medical history.  History reviewed. No pertinent surgical history.   Current Outpatient Medications on File Prior to Visit  Medication Sig Dispense Refill   albuterol (VENTOLIN HFA) 108 (90 Base) MCG/ACT inhaler INHALE 2 PUFFS INTO THE LUNGS EVERY 6 (SIX) HOURS AS NEEDED FOR WHEEZING OR SHORTNESS OF BREATH (15 TO 30 MIN PRIOR TO ACTIVITY). 18 g 1   EMGALITY 120 MG/ML SOAJ Inject 120 mLs into the skin every 30 (thirty) days.     esomeprazole (NEXIUM) 20 MG capsule Take 20 mg by mouth daily at 12 noon.     LO LOESTRIN FE 1 MG-10 MCG / 10 MCG tablet Take 1 tablet by mouth  daily.     meloxicam (MOBIC) 7.5 MG tablet meloxicam 7.5 mg tablet     montelukast (SINGULAIR) 10 MG tablet Take 10 mg by mouth at bedtime.     Multiple Vitamins-Minerals (MULTIVITAMIN WITH MINERALS) tablet Take 1 tablet by mouth daily.     OZEMPIC, 0.25 OR 0.5 MG/DOSE, 2 MG/1.5ML SOPN Inject 0.25 mg into the skin once a week. Will increase to .      rizatriptan (MAXALT) 5 MG tablet rizatriptan 5 mg tablet     venlafaxine XR (EFFEXOR-XR) 75 MG 24 hr capsule venlafaxine ER 75 mg  capsule,extended release 24 hr     fluticasone (FLONASE) 50 MCG/ACT nasal spray Place 2 sprays into both nostrils daily. 16 g 0   No current facility-administered medications on file prior to visit.    No Active Allergies  Physical exam:  Today's Vitals   08/29/20 1357  BP: 133/86  Pulse: 81  Weight: 270 lb (122.5 kg)  Height: 5\' 3"  (1.6 m)   Body mass index is 47.83 kg/m.   Wt Readings from Last 3 Encounters:  08/29/20 270 lb (122.5 kg)     Ht Readings from Last 3 Encounters:  08/29/20 5\' 3"  (1.6 m)      General: The patient is awake, alert and appears not in acute distress. The patient is well groomed. Head: Normocephalic, atraumatic. Neck is supple. Mallampati 2 plus ,  neck circumference:15.5  inches . Nasal airflow _ septal deviation , leaving the left nasion impinged. .  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  Dental status: small oral opening, small jaw -  Cardiovascular:  Regular rate and cardiac rhythm by pulse,  without distended neck veins. Respiratory: Lungs are clear to auscultation.  Skin:  Without evidence of ankle edema, or rash. Trunk: The patient's posture is erect.   Neurologic exam : The patient is awake and alert, oriented to place and time.   Memory subjective described as intact.  Attention span & concentration ability appears normal.  Speech is fluent,  without  dysarthria, dysphonia or aphasia.  Mood and affect are appropriate.   Cranial nerves: no loss of smell or taste  reported  Pupils are equal and briskly reactive to light. Funduscopic exam deferred. Reports blurred vision- headache associated. 10/29/20  Extraocular movements in vertical and horizontal planes were intact and without nystagmus. No Diplopia. Visual fields by finger perimetry are intact. Hearing was intact to soft voice and finger rubbing.    Facial sensation intact to fine touch.  Facial motor strength is symmetric and tongue and uvula move midline.  Neck ROM : rotation, tilt and flexion extension were normal for age and shoulder shrug was symmetrical.    Motor exam:  Symmetric bulk, tone and ROM.   Normal tone without cog -wheeling, symmetric grip strength .   Sensory:  Fine touch, pinprick and vibration were tested  and  normal.  Proprioception tested in the upper extremities was normal.   Coordination: Rapid alternating movements in the fingers/hands were of normal speed.  The Finger-to-nose maneuver was intact without evidence of ataxia, dysmetria or tremor.   Gait and station: Patient could rise unassisted from a seated position, walked without assistive device.  Stance is of wide base .  Toe and heel walk were deferred.  Deep tendon reflexes: in the  upper and lower extremities are symmetric and intact.  Babinski response was deferred.       After spending a total time of 50 minutes face to face and additional time for physical and neurologic examination, review of laboratory studies,  personal review of imaging studies, reports and results of other testing and review of referral information / records as far as provided in visit, I have established the following assessments:  1) I had the pleasure of meeting Katherine [name] today a 45 year old Caucasian female with a history of by now intractable migraines that affect her nearly every day of the month.  These headaches are still migrainous in nature but they have become chronic and she has had status migrainosus episodes before.  They  are characterized as a headaches  with a retro-orbital pressure sensation throbbing usually associated with photophobia, phonophobia and also sensitivity to smell.  She does become nauseated when she has these headaches, and in addition she often wakes up with a headache in the morning.  She has also been woken out of sleep by cluster headaches for sharp eye sticking sensations also feeling as if her head is penetrated.  Cluster headaches are often seen in patients with hypoxemia and migraines.  At the current time I think it is very important to look at the possible presence of obstructive sleep apnea because of her body mass index by now 47.8, a very small densely crowded lower jaw small oral opening, retrognathia, high-grade Mallampati.  She also notes that she is snoring but she has never been told that she has apneas.  2) She also said that some of her headaches seem to arise from the cervical spine and she recently had an x-ray on 07-28-2020 which reportedly confirmed the presence of degenerative disc disease. Occipital neuralgia ?   So I will order a sleep study which in her case will likely be a home sleep test.  She has reported that Emgality initially helped her headaches but now seems to no longer do such so I will refer her to one of our headache specialist either Dr. Lucia Gaskins or Dr. Delena Bali, for BOTOX .  Rheumatologist is Dr. Eddie Dibbles, at [facility], . She diagnosed fibromyalgia.       My Plan is to proceed with:  1)HST 2) Occipital neuralgia - nerve block .  3) Botox.   I would like to thank Merri Brunette, MD  [address] [place],  [place] [address] for allowing me to meet with and to take care of this pleasant patient.   In short, Katherine [name] is presenting with possible OSA, attributed symptoms.  I plan to follow up either personally or through our NP within 2-4  month.   CC: I will share my notes with PCP. Marland Kitchen  Electronically signed by: Melvyn Novas, MD 08/29/2020 2:26  PM  [facility] Board certified by The [employer] of Sleep Medicine and Diplomate of the [employer] of [employer] Medicine. Board certified In Neurology through the ABPN, Fellow of the [employer] of Neu[employer]. Medical Director of [facility].

## 2020-08-30 ENCOUNTER — Other Ambulatory Visit: Payer: Self-pay | Admitting: *Deleted

## 2020-08-30 ENCOUNTER — Telehealth: Payer: Self-pay | Admitting: Neurology

## 2020-08-30 ENCOUNTER — Encounter: Payer: Self-pay | Admitting: Neurology

## 2020-08-30 DIAGNOSIS — G43109 Migraine with aura, not intractable, without status migrainosus: Secondary | ICD-10-CM

## 2020-08-30 MED ORDER — TOPIRAMATE 25 MG PO TABS: 25.0000 mg | ORAL_TABLET | Freq: Two times a day (BID) | ORAL | 5 refills | Status: DC

## 2020-08-30 NOTE — Telephone Encounter (Signed)
LVM for pt to call me back to schedule sleep study  

## 2020-08-30 NOTE — Telephone Encounter (Signed)
I received patient's voicemail, but I haven't received any paperwork or messages about starting Botox for this patient. Please let me know what you'd like to do.

## 2020-09-05 ENCOUNTER — Other Ambulatory Visit: Payer: Self-pay | Admitting: Otolaryngology

## 2020-09-05 DIAGNOSIS — J329 Chronic sinusitis, unspecified: Secondary | ICD-10-CM

## 2020-09-21 ENCOUNTER — Ambulatory Visit
Admission: RE | Admit: 2020-09-21 | Discharge: 2020-09-21 | Disposition: A | Discharge status: Home / Self Care | Payer: BC Managed Care – PPO | Source: Ambulatory Visit | Attending: Otolaryngology | Admitting: Otolaryngology

## 2020-09-21 DIAGNOSIS — J329 Chronic sinusitis, unspecified: Secondary | ICD-10-CM

## 2020-10-04 ENCOUNTER — Other Ambulatory Visit: Payer: Self-pay | Admitting: Otolaryngology

## 2020-10-10 ENCOUNTER — Ambulatory Visit: Payer: BC Managed Care – PPO | Admitting: Psychiatry

## 2020-11-03 ENCOUNTER — Other Ambulatory Visit: Payer: Self-pay | Admitting: Registered Nurse

## 2020-11-03 DIAGNOSIS — R7303 Prediabetes: Secondary | ICD-10-CM

## 2020-12-01 ENCOUNTER — Ambulatory Visit: Payer: BC Managed Care – PPO | Admitting: Neurology

## 2020-12-02 ENCOUNTER — Ambulatory Visit
Admission: RE | Admit: 2020-12-02 | Discharge: 2020-12-02 | Disposition: A | Discharge status: Home / Self Care | Payer: BC Managed Care – PPO | Source: Ambulatory Visit | Attending: Registered Nurse | Admitting: Registered Nurse

## 2020-12-02 DIAGNOSIS — R7303 Prediabetes: Secondary | ICD-10-CM

## 2021-01-24 ENCOUNTER — Encounter (HOSPITAL_BASED_OUTPATIENT_CLINIC_OR_DEPARTMENT_OTHER): Payer: Self-pay

## 2021-01-24 ENCOUNTER — Ambulatory Visit (HOSPITAL_BASED_OUTPATIENT_CLINIC_OR_DEPARTMENT_OTHER): Admit: 2021-01-24 | Payer: BC Managed Care – PPO | Admitting: Otolaryngology

## 2021-01-24 SURGERY — SEPTOPLASTY, NOSE, WITH NASAL TURBINATE REDUCTION
Anesthesia: General | Laterality: Bilateral

## 2022-01-22 HISTORY — PX: THYROIDECTOMY, PARTIAL: SHX18

## 2022-02-21 IMAGING — CT CT MAXILLOFACIAL W/O CM
3 of 5 series · 15 of 47 positions shown, 18 images · non-contrast
Comparison: No pertinent prior exams available for comparison.

CLINICAL DATA: Chronic sinusitis, unspecified location
([id number]). Additional history provided by scanning technologist:
with antibiotics.

EXAM:
CT MAXILLOFACIAL WITHOUT CONTRAST
TECHNIQUE: Multidetector CT images of the paranasal sinuses were obtained using
the standard protocol without intravenous contrast.

[Series 4: sinus 2.00 hr60 s3 cor bone · coronal · 0.31mm/px · 3 of 96 slices shown]
[im 32/96  bone]
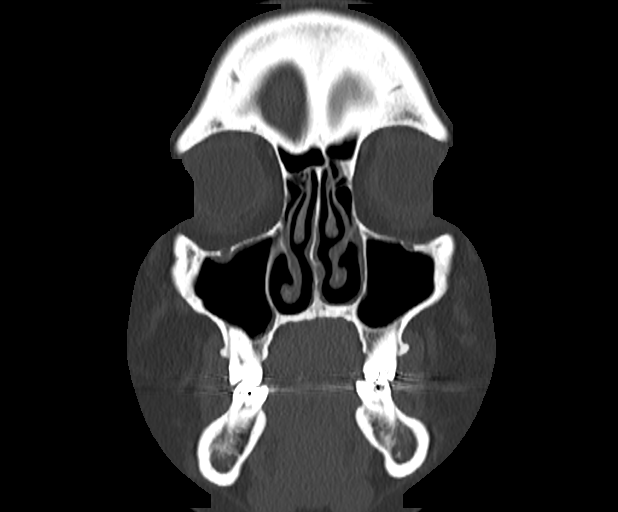
[im 43/96  bone]
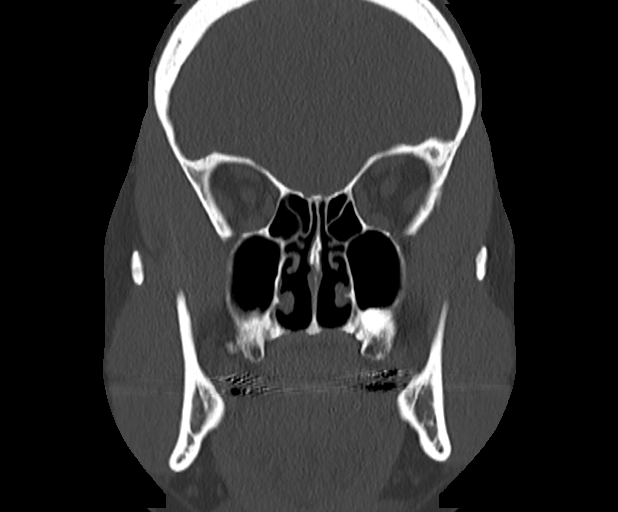
[im 53/96  bone]
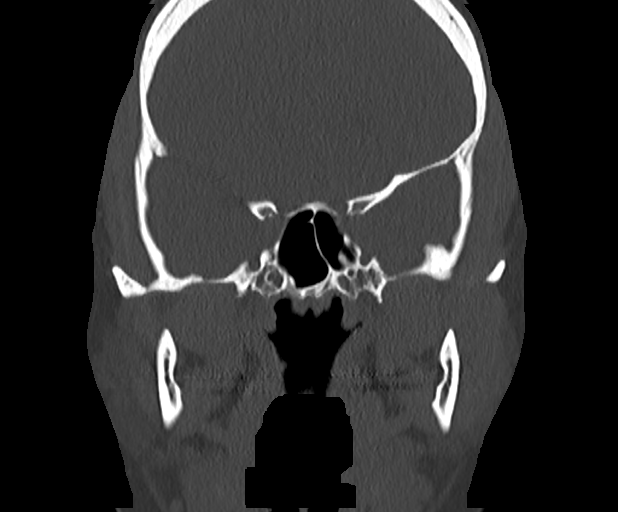

[Series 6: sinus 2.00 hr60 s3 sag bone · sagittal · 0.31mm/px · 3 of 96 slices shown]
[im 32/96  bone]
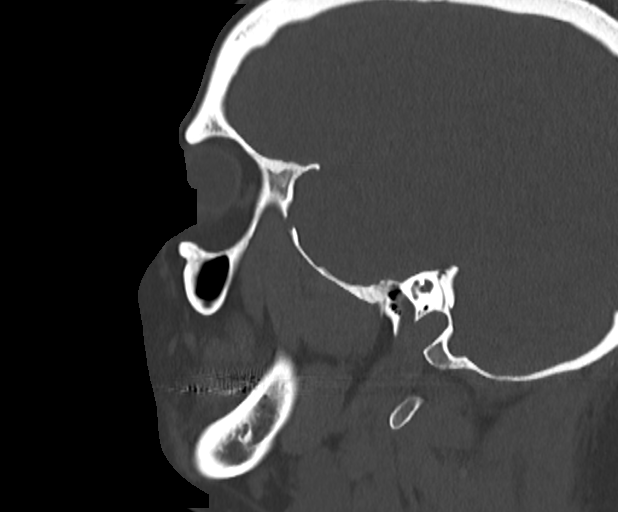
[im 48/96  bone]
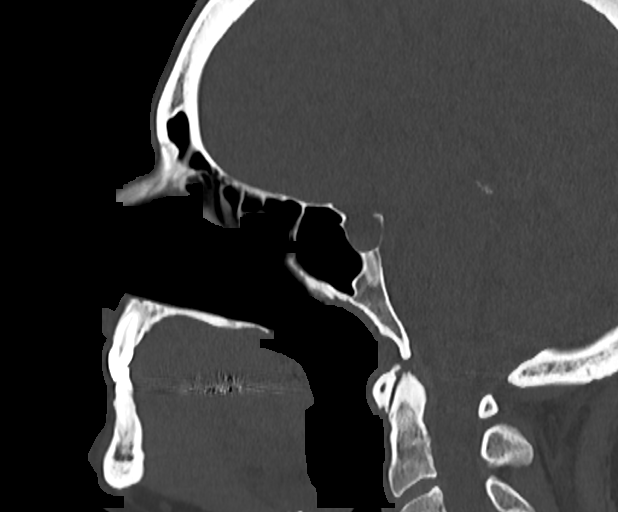
[im 64/96  bone]
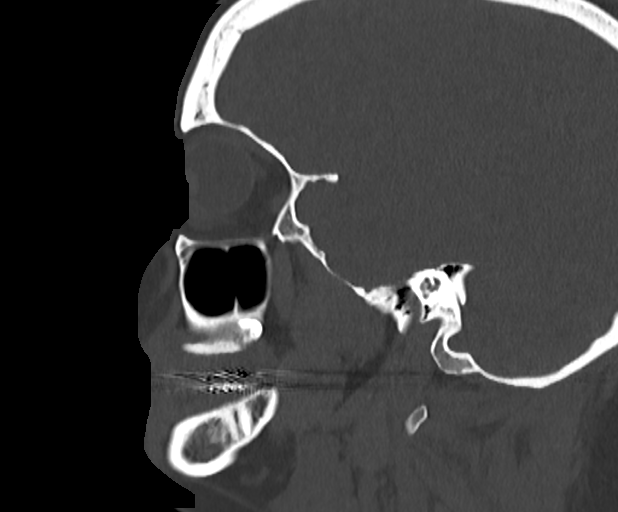

[Series 10: sinus 0.60 hr60 s3 axial fusion thins · axial · 0.38mm/px · z∈[-634,-492]mm · 9 of 267 slices shown, 12 images]
[im 15/267  brain]
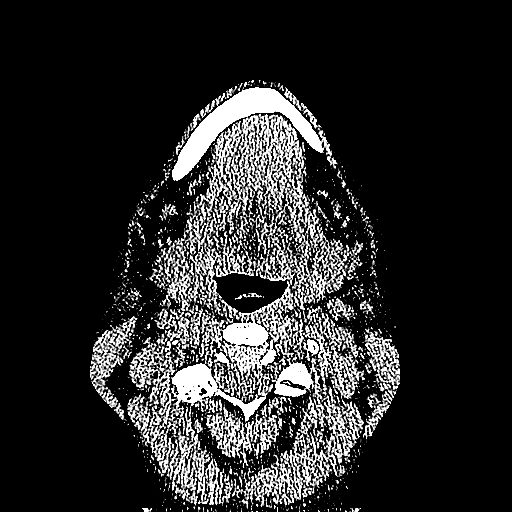
[im 15/267  bone]
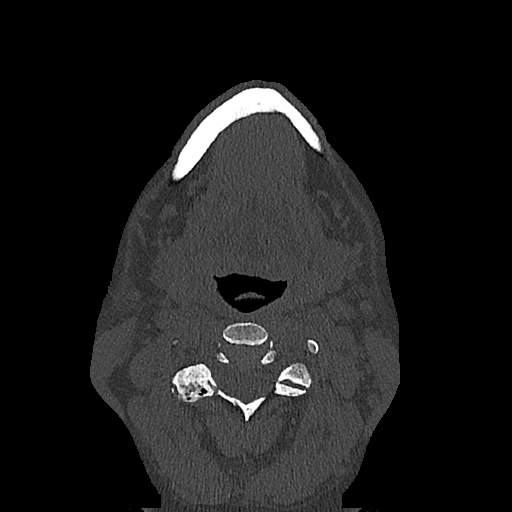
[im 45/267  bone]
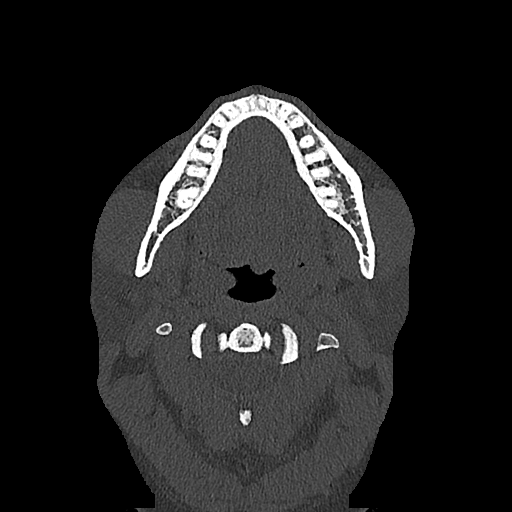
[im 74/267  bone]
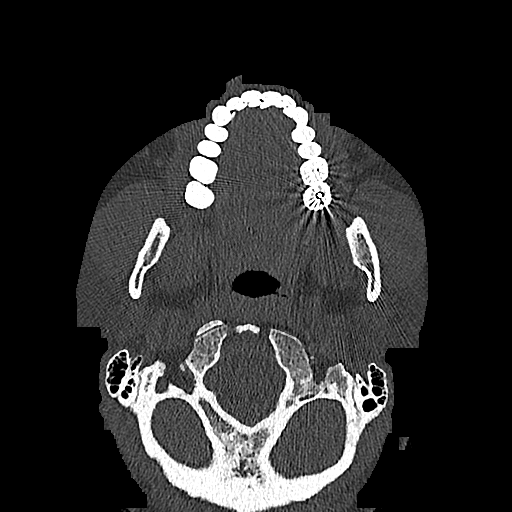
[im 104/267  bone]
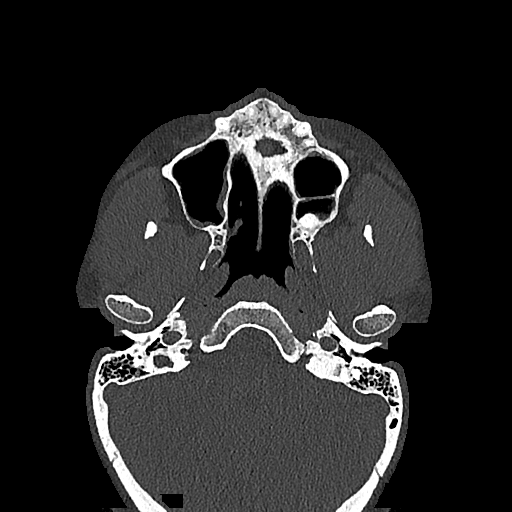
[im 134/267  brain]
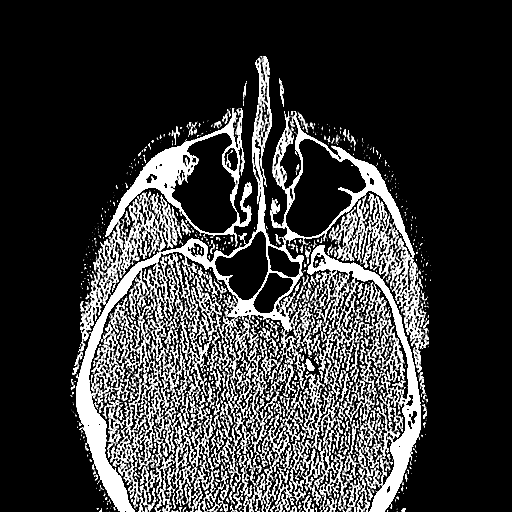
[im 134/267  bone]
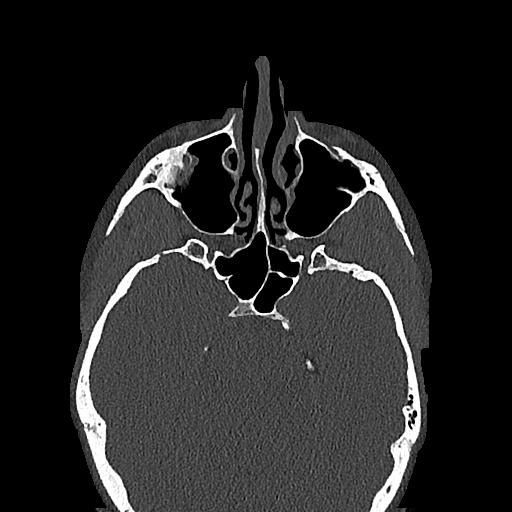
[im 163/267  bone]
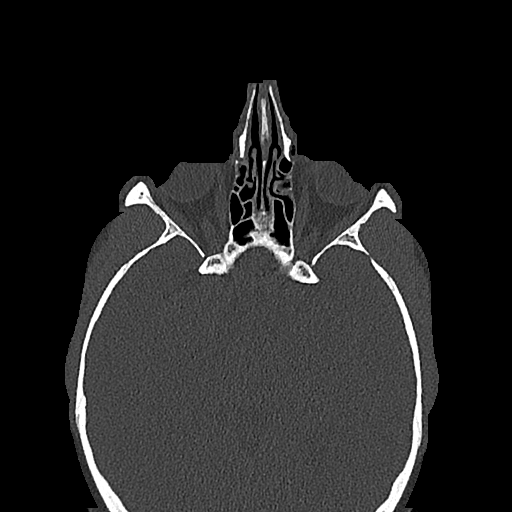
[im 193/267  bone]
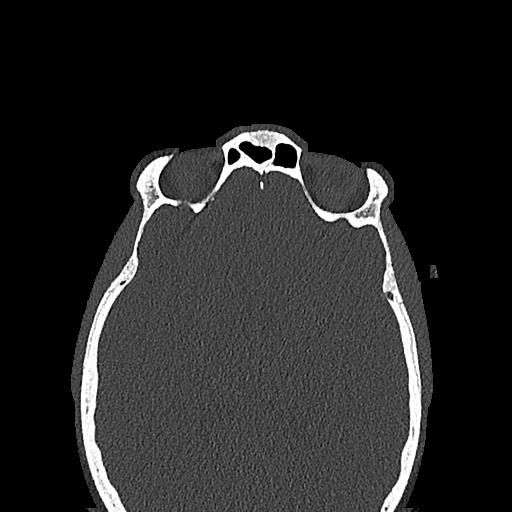
[im 222/267  bone]
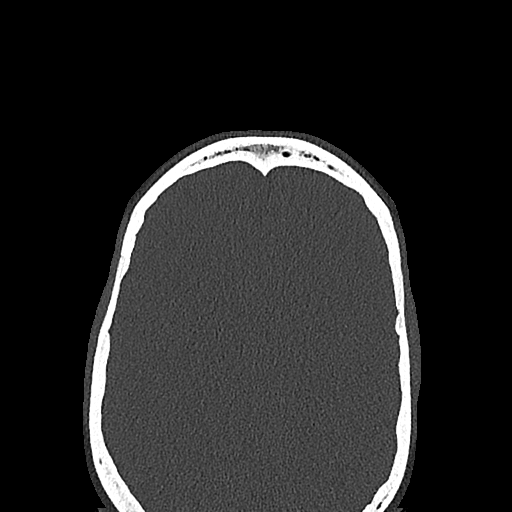
[im 252/267  brain]
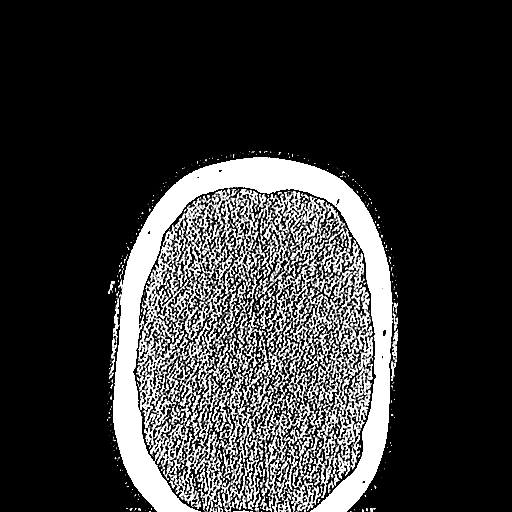
[im 252/267  bone]
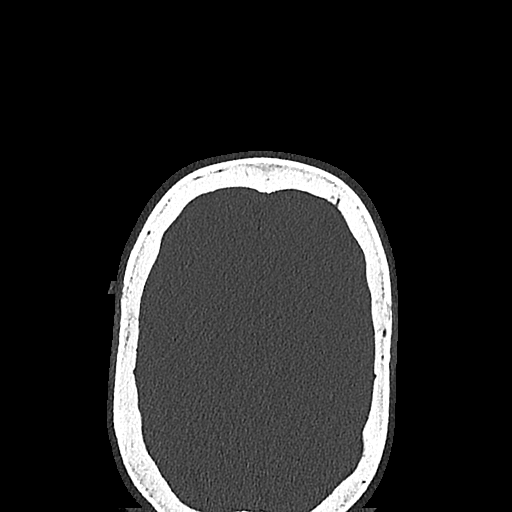

[15 of 47 positions shown; findings below may reference images not displayed]

FINDINGS: Paranasal sinuses:

Frontal: Normally aerated. Patent frontal sinus drainage pathways.

Ethmoid: Normally aerated.

Maxillary: Minimal mucosal thickening within the bilateral maxillary
sinuses.

Sphenoid: Tiny mucous retention cyst within the right sphenoid
sinus. The sphenoid sinuses are otherwise normally aerated. Patent
sphenoethmoidal recesses.

Right ostiomeatal unit: Patent

Left ostiomeatal unit: Patent.

Nasal passages: Patent. Rightward deviation of the mid bony nasal
septum. Leftward deviation of the bony nasal septum more
posteriorly.

Anatomy: No pneumatization is present superior to the anterior
ethmoid notches. Symmetric and intact olfactory grooves and fovea
ethmoidalis, Keros II (4-7mm). Sellar sphenoid pneumatization
pattern.
IMPRESSION: Minimal mucosal thickening within the bilateral maxillary sinuses.

Tiny mucous retention cyst within the right sphenoid sinus.

Patent sinus drainage pathways.

Rightward deviation of the mid bony nasal septum. Leftward deviation
of the bony nasal septum more posteriorly.

## 2022-08-02 ENCOUNTER — Encounter: Payer: Self-pay | Admitting: Nurse Practitioner

## 2022-08-06 ENCOUNTER — Other Ambulatory Visit: Payer: Self-pay | Admitting: Nurse Practitioner

## 2022-08-06 DIAGNOSIS — E041 Nontoxic single thyroid nodule: Secondary | ICD-10-CM

## 2022-08-06 DIAGNOSIS — E042 Nontoxic multinodular goiter: Secondary | ICD-10-CM

## 2022-08-14 ENCOUNTER — Other Ambulatory Visit: Payer: BC Managed Care – PPO

## 2022-11-22 NOTE — H&P (Signed)
Katherine [name] is a 47 y.o. female, G: 0 who presents for hysteroscopic removal of an endometrial polyp because of abnormal uterine bleeding and endometrial polyp.   The patient was referred to our office in September 2024 after several months of  bleeding 14 days each month. The patient had tried and failed combination oral contraception for her bleeding that also caused her significant nausea and dizziness. She was then placed on norethindrone 5 mg which rendered her amenorrheic but the nausea persisted.  An endometrial biopsy during this evaluation returned polypoid fragments of normal endometrium.  Given the patient's endometrial biopsy findings, poor response and side effects related to hormonal therapy,  she wishes to proceed with a hysteroscopic polypectomy and placement of a Mirena IUD.   Past Medical History  OB History: G: 0  GYN History: menarche: 47YO;    LMP: 07/2022;    Contracepton condoms    . Last PAP smear: 2024 - normal  Medical History: Diabetes Mellitus, Anxiety, ADHD, Depression, GERD, Urethral Stricture, Migraine, Fibroid and Asthma  Surgical History:  2024  Partial Thyroidectomy (left lobe); Tonsillectomy   Family History: Hypertension, Thyroid Disease, ADD and Diabetes Mellitus  Social History: Married and employed as a Professor at [employer];  Denies tobacco or alcohol use  Medications:  AdderalL (10mg ) bid Albuterol sulfate HFA 90 mcg/actuation aerosol inhaler 2 puffs every 4 hours prn Azelastine 137 mcg (0.1 %) nasal spray 2 puffs daily Buspirone 10 mg tablet bid Celecoxib 200 mg capsule 1 capsule bid as directed Epinephrine 0.3 mg/0.3 mL injection, auto-injector prn EstradioL 0.01% (0.1 mg/gram) vaginal cream  1 gram pv twice a week  Flunisolide 25 mcg (0.025 %) nasal spray  2 sprays in each nostril bid Gabapentin 300 mg capsule qhs Ipratropium bromide 42 mcg (0.06 %) nasal spray 2 sprays in each nostril  tid 4 days weekly Metformin ER 500 mg tablet,extended  release 24 hr every pm with meal Montelukast 10 mg tablet daily x 90 days Mounjaro 5 mg/0.5 mL subcutaneous pen injector weekly  Norethindrone acetate 5 mg tablet 1/2 po daily Ondansetron 4 mg disintegrating tablet every 6 hours prn Oxybutynin chloride ER 10 mg tablet,extended release 24 hr daily Rizatriptan 10 mg tablet  1 po stat prn may repeat in 2 hours TraZODone 50 mg tablet po qhs Venlafaxine ER 75 mg capsule,extended release daily  Allergy:  none;  sensitive to  Penicillin (causes ears to burn intensely)  ROS: Admits to glasses , occasional migraine headaches but but denies nasal congestion, dysphagia, tinnitus, dizziness, hoarseness, cough,  chest pain, shortness of breath, nausea, vomiting, diarrhea,constipation,  urinary frequency, urgency  dysuria, hematuria, vaginitis symptoms, pelvic pain, swelling of joints,easy bruising,  myalgias, arthralgias, skin rashes, unexplained weight loss and except as is mentioned in the history of present illness, patient's review of systems is otherwise negative.    Physical Exam  Bp: 130/78;  Weight: 272 lbs.;  Height: 5';  BMI: 53.1  Neck: supple without masses or thyromegaly Lungs: clear to auscultation Heart: regular rate and rhythm Abdomen: soft, non-tender and no organomegaly Pelvic:EGBUS- wnl; vagina-normal rugae; uterus-normal size (exam limited by habitus)  cervix without lesions or motion tenderness; adnexae-no tenderness or masses Extremities:  no clubbing, cyanosis or edema   Assesment: Abnormal Uterine Bleeding                      Endometrial Polyp   Disposition:  A discussion was held with patient regarding the indication for her procedure(s) along with  the risks, which include but are not limited to: reaction to anesthesia, damage to adjacent organs ( to include uterine perforation, bowels and bladder(, infection and excessive bleeding.The Patient verbalized understanding of these risks and has consented to proceed with a  Hysteroscopic Removal of Endometrial Polyp, Dilatation, Curettage and Placement of a Mirena IUD at [facility] ([place]) on December 06, 2022.    CSN# [id number]   Trinitey Roache J. Lowell Guitar, PA-C  for Dr. Crist Fat. Rivard

## 2022-11-29 ENCOUNTER — Encounter (HOSPITAL_BASED_OUTPATIENT_CLINIC_OR_DEPARTMENT_OTHER): Payer: Self-pay | Admitting: Obstetrics and Gynecology

## 2022-11-29 NOTE — Progress Notes (Signed)
Spoke w/ via phone for pre-op interview--- Katherine [name] needs dos----   CBC, T&S and UPT per surgeon.      [name] results------ COVID test -----patient states asymptomatic no test needed Arrive at -------0950 NPO after MN NO Solid Food.  Clear liquids from MN until---0850 Med rec completed Medications to take morning of surgery ----- Effexor and Buspar. Zofran if needed.Bring Albuterol inhaler. Diabetic medication ----- Patient instructed no nail polish to be worn day of surgery Patient instructed to bring photo id and insurance card day of surgery Patient aware to have Driver (ride ) / caregiver    for 24 hours after surgery - Katherine [name] Patient Special Instructions ----- Pt takes Katherine [name], last dose 11/23/22, pt verbalized understanding to not take another dose until after procedure. Pre-Op special Instructions ----- Patient verbalized understanding of instructions that were given at this phone interview. Patient denies chest pain, sob, fever, cough at the interview.

## 2022-12-05 NOTE — Progress Notes (Signed)
Pt called today with questions, stated she has surgery tomorrow 11/14 @ [facility].  Inquired if she were have a headache morning of surgery could she take acetaminophen, told pt she could and updated med list in epic. Pt stated she has a new medication to add to list , baclofen. Then pt wanted to know if clear liquid if she could have the V8 that is not the tomato kind, told pt I would not they have citrus in them, she verbalized understanding not to drink it.

## 2022-12-06 ENCOUNTER — Ambulatory Visit (HOSPITAL_BASED_OUTPATIENT_CLINIC_OR_DEPARTMENT_OTHER): Payer: BC Managed Care – PPO | Admitting: Anesthesiology

## 2022-12-06 ENCOUNTER — Other Ambulatory Visit: Payer: Self-pay

## 2022-12-06 ENCOUNTER — Encounter (HOSPITAL_BASED_OUTPATIENT_CLINIC_OR_DEPARTMENT_OTHER)
Admission: RE | Disposition: A | Discharge status: Home / Self Care | Payer: Self-pay | Source: Home / Self Care | Attending: Obstetrics and Gynecology

## 2022-12-06 ENCOUNTER — Ambulatory Visit (HOSPITAL_BASED_OUTPATIENT_CLINIC_OR_DEPARTMENT_OTHER)
Admission: RE | Admit: 2022-12-06 | Discharge: 2022-12-06 | Disposition: A | Discharge status: Home / Self Care | Payer: BC Managed Care – PPO | Attending: Obstetrics and Gynecology | Admitting: Obstetrics and Gynecology

## 2022-12-06 ENCOUNTER — Encounter (HOSPITAL_BASED_OUTPATIENT_CLINIC_OR_DEPARTMENT_OTHER): Payer: Self-pay | Admitting: Obstetrics and Gynecology

## 2022-12-06 DIAGNOSIS — Z6841 Body Mass Index (BMI) 40.0 and over, adult: Secondary | ICD-10-CM | POA: Diagnosis not present

## 2022-12-06 DIAGNOSIS — N840 Polyp of corpus uteri: Secondary | ICD-10-CM | POA: Diagnosis not present

## 2022-12-06 DIAGNOSIS — N939 Abnormal uterine and vaginal bleeding, unspecified: Secondary | ICD-10-CM | POA: Insufficient documentation

## 2022-12-06 HISTORY — DX: Other specified postprocedural states: Z98.890

## 2022-12-06 HISTORY — DX: Other specified postprocedural states: R11.2

## 2022-12-06 HISTORY — PX: POLYPECTOMY: SHX5525

## 2022-12-06 HISTORY — PX: HYSTEROSCOPY WITH D & C: SHX1775

## 2022-12-06 HISTORY — DX: Headache, unspecified: R51.9

## 2022-12-06 HISTORY — PX: INTRAUTERINE DEVICE (IUD) INSERTION: SHX5877

## 2022-12-06 HISTORY — DX: Attention-deficit hyperactivity disorder, unspecified type: F90.9

## 2022-12-06 HISTORY — DX: Gastro-esophageal reflux disease without esophagitis: K21.9

## 2022-12-06 LAB — CBC
HCT: 41.7 % (ref 36.0–46.0)
Hemoglobin: 13.7 g/dL (ref 12.0–15.0)
MCH: 28.8 pg (ref 26.0–34.0)
MCHC: 32.9 g/dL (ref 30.0–36.0)
MCV: 87.8 fL (ref 80.0–100.0)
Platelets: 341 10*3/uL (ref 150–400)
RBC: 4.75 MIL/uL (ref 3.87–5.11)
RDW: 14.2 % (ref 11.5–15.5)
WBC: 8.1 10*3/uL (ref 4.0–10.5)
nRBC: 0 % (ref 0.0–0.2)

## 2022-12-06 LAB — GLUCOSE, CAPILLARY: Glucose-Capillary: 112 mg/dL — ABNORMAL HIGH (ref 70–99)

## 2022-12-06 LAB — TYPE AND SCREEN
ABO/RH(D): O POS
Antibody Screen: NEGATIVE

## 2022-12-06 LAB — POCT PREGNANCY, URINE: Preg Test, Ur: NEGATIVE

## 2022-12-06 LAB — ABO/RH: ABO/RH(D): O POS

## 2022-12-06 SURGERY — DILATATION AND CURETTAGE /HYSTEROSCOPY
Anesthesia: General | Site: Uterus

## 2022-12-06 MED ORDER — FENTANYL CITRATE (PF) 100 MCG/2ML IJ SOLN
INTRAMUSCULAR | Status: AC
  Filled 2022-12-06: qty 2

## 2022-12-06 MED ORDER — ENSURE PRE-SURGERY PO LIQD: 296.0000 mL | Freq: Once | ORAL | Status: DC

## 2022-12-06 MED ORDER — LEVONORGESTREL 20 MCG/DAY IU IUD
INTRAUTERINE_SYSTEM | INTRAUTERINE | Status: AC
  Filled 2022-12-06: qty 1

## 2022-12-06 MED ORDER — LACTATED RINGERS IV SOLN: INTRAVENOUS | Status: DC

## 2022-12-06 MED ORDER — DEXMEDETOMIDINE HCL IN NACL 80 MCG/20ML IV SOLN
INTRAVENOUS | Status: DC | PRN
  Administered 2022-12-06 (×2): 4 ug via INTRAVENOUS

## 2022-12-06 MED ORDER — FENTANYL CITRATE (PF) 100 MCG/2ML IJ SOLN
INTRAMUSCULAR | Status: DC | PRN
  Administered 2022-12-06: 50 ug via INTRAVENOUS
  Administered 2022-12-06 (×2): 25 ug via INTRAVENOUS

## 2022-12-06 MED ORDER — CELECOXIB 200 MG PO CAPS
ORAL_CAPSULE | ORAL | Status: AC
  Filled 2022-12-06: qty 2

## 2022-12-06 MED ORDER — ACETAMINOPHEN 500 MG PO TABS
1000.0000 mg | ORAL_TABLET | ORAL | Status: AC
  Administered 2022-12-06: 1000 mg via ORAL

## 2022-12-06 MED ORDER — PROPOFOL 10 MG/ML IV BOLUS
INTRAVENOUS | Status: AC
  Filled 2022-12-06: qty 20

## 2022-12-06 MED ORDER — CHLOROPROCAINE HCL 1 % IJ SOLN
INTRAMUSCULAR | Status: DC | PRN
  Administered 2022-12-06: 10 mL

## 2022-12-06 MED ORDER — MIDAZOLAM HCL 2 MG/2ML IJ SOLN
2.0000 mg | Freq: Once | INTRAMUSCULAR | Status: AC
  Administered 2022-12-06: 2 mg via INTRAVENOUS

## 2022-12-06 MED ORDER — GABAPENTIN 300 MG PO CAPS: 300.0000 mg | ORAL_CAPSULE | ORAL | Status: DC

## 2022-12-06 MED ORDER — LEVONORGESTREL 20 MCG/DAY IU IUD
1.0000 | INTRAUTERINE_SYSTEM | INTRAUTERINE | Status: AC
  Administered 2022-12-06: 1 via INTRAUTERINE

## 2022-12-06 MED ORDER — PROPOFOL 10 MG/ML IV BOLUS
INTRAVENOUS | Status: DC | PRN
  Administered 2022-12-06: 200 mg via INTRAVENOUS

## 2022-12-06 MED ORDER — FENTANYL CITRATE (PF) 100 MCG/2ML IJ SOLN
25.0000 ug | Freq: Once | INTRAMUSCULAR | Status: AC
  Administered 2022-12-06: 25 ug via INTRAVENOUS

## 2022-12-06 MED ORDER — ACETAMINOPHEN 500 MG PO TABS
ORAL_TABLET | ORAL | Status: AC
  Filled 2022-12-06: qty 2

## 2022-12-06 MED ORDER — LIDOCAINE 2% (20 MG/ML) 5 ML SYRINGE
INTRAMUSCULAR | Status: DC | PRN
  Administered 2022-12-06: 100 mg via INTRAVENOUS

## 2022-12-06 MED ORDER — GABAPENTIN 300 MG PO CAPS
ORAL_CAPSULE | ORAL | Status: AC
  Filled 2022-12-06: qty 1

## 2022-12-06 MED ORDER — SILVER NITRATE-POT NITRATE 75-25 % EX MISC
CUTANEOUS | Status: AC
  Filled 2022-12-06: qty 10

## 2022-12-06 MED ORDER — SODIUM CHLORIDE 0.9 % IR SOLN
Status: DC | PRN
  Administered 2022-12-06: 200 mL

## 2022-12-06 MED ORDER — DEXAMETHASONE SODIUM PHOSPHATE 10 MG/ML IJ SOLN
INTRAMUSCULAR | Status: DC | PRN
  Administered 2022-12-06: 10 mg via INTRAVENOUS

## 2022-12-06 MED ORDER — POVIDONE-IODINE 10 % EX SWAB: 2.0000 | Freq: Once | CUTANEOUS | Status: DC

## 2022-12-06 MED ORDER — MIDAZOLAM HCL 2 MG/2ML IJ SOLN
INTRAMUSCULAR | Status: AC
  Filled 2022-12-06: qty 2

## 2022-12-06 MED ORDER — SCOPOLAMINE 1 MG/3DAYS TD PT72
MEDICATED_PATCH | TRANSDERMAL | Status: AC
  Filled 2022-12-06: qty 1

## 2022-12-06 MED ORDER — ONDANSETRON HCL 4 MG/2ML IJ SOLN
INTRAMUSCULAR | Status: DC | PRN
  Administered 2022-12-06: 4 mg via INTRAVENOUS

## 2022-12-06 MED ORDER — CELECOXIB 200 MG PO CAPS: 400.0000 mg | ORAL_CAPSULE | ORAL | Status: DC

## 2022-12-06 MED ORDER — SCOPOLAMINE 1 MG/3DAYS TD PT72
1.0000 | MEDICATED_PATCH | TRANSDERMAL | Status: DC
  Administered 2022-12-06: 1.5 mg via TRANSDERMAL

## 2022-12-06 MED ORDER — MIDAZOLAM HCL 5 MG/5ML IJ SOLN
INTRAMUSCULAR | Status: DC | PRN
  Administered 2022-12-06: 2 mg via INTRAVENOUS

## 2022-12-06 MED ORDER — SODIUM CHLORIDE 0.9 % IV SOLN: INTRAVENOUS | Status: DC

## 2022-12-06 SURGICAL SUPPLY — 17 items
CATH ROBINSON RED A/P 16FR (CATHETERS) ×1 IMPLANT
DILATOR CANAL MILEX (MISCELLANEOUS) IMPLANT
GAUZE 4X4 16PLY ~~LOC~~+RFID DBL (SPONGE) IMPLANT
GLOVE BIOGEL PI IND STRL 7.0 (GLOVE) ×1 IMPLANT
GLOVE ECLIPSE 6.5 STRL STRAW (GLOVE) ×1 IMPLANT
GOWN STRL REUS W/TWL LRG LVL3 (GOWN DISPOSABLE) ×1 IMPLANT
IV NS IRRIG 3000ML ARTHROMATIC (IV SOLUTION) ×1 IMPLANT
KIT PROCEDURE FLUENT (KITS) ×1 IMPLANT
KIT TURNOVER CYSTO (KITS) ×1 IMPLANT
Mirena IMPLANT
PACK VAGINAL MINOR WOMEN LF (CUSTOM PROCEDURE TRAY) ×1 IMPLANT
PAD OB MATERNITY 4.3X12.25 (PERSONAL CARE ITEMS) ×1 IMPLANT
PAD PREP 24X48 CUFFED NSTRL (MISCELLANEOUS) ×1 IMPLANT
SEAL ROD LENS SCOPE MYOSURE (ABLATOR) ×1 IMPLANT
SLEEVE SCD COMPRESS KNEE MED (STOCKING) ×1 IMPLANT
TOWEL OR 17X24 6PK STRL BLUE (TOWEL DISPOSABLE) ×2 IMPLANT
WATER STERILE IRR 500ML POUR (IV SOLUTION) ×1 IMPLANT

## 2022-12-06 NOTE — Anesthesia Postprocedure Evaluation (Signed)
Anesthesia Post Note  Patient: Katherine [name]  Procedure(s) Performed: DILATATION AND CURETTAGE /HYSTEROSCOPY (Uterus) POLYPECTOMY (Uterus) INTRAUTERINE DEVICE (IUD) INSERTION (Uterus)     Patient location during evaluation: PACU Anesthesia Type: General Level of consciousness: awake and alert Pain management: pain level controlled Vital Signs Assessment: post-procedure vital signs reviewed and stable Respiratory status: spontaneous breathing, nonlabored ventilation, respiratory function stable and patient connected to nasal cannula oxygen Cardiovascular status: blood pressure returned to baseline and stable Postop Assessment: no apparent nausea or vomiting Anesthetic complications: no  No notable events documented.  Last Vitals:  Vitals:   12/06/22 1415 12/06/22 1430  BP: (!) 150/102 (!) 141/81  Pulse: (!) 107 95  Resp: 20 13  Temp:    SpO2: 97% 100%    Last Pain:  Vitals:   12/06/22 1057  TempSrc: Oral  PainSc: 0-No pain                 Shelton Silvas

## 2022-12-06 NOTE — Discharge Instructions (Addendum)
  Post Anesthesia Home Care Instructions  Activity: Get plenty of rest for the remainder of the day. A responsible individual must stay with you for 24 hours following the procedure.  For the next 24 hours, DO NOT: -Drive a car -Advertising copywriter -Drink alcoholic beverages -Take any medication unless instructed by your physician -Make any legal decisions or sign important papers.  Meals: Start with liquid foods such as gelatin or soup. Progress to regular foods as tolerated. Avoid greasy, spicy, heavy foods. If nausea and/or vomiting occur, drink only clear liquids until the nausea and/or vomiting subsides. Call your physician if vomiting continues.  Special Instructions/Symptoms: Your throat may feel dry or sore from the anesthesia or the breathing tube placed in your throat during surgery. If this causes discomfort, gargle with warm salt water. The discomfort should disappear within 24 hours.  If you had a scopolamine patch placed behind your ear for the management of post- operative nausea and/or vomiting:  1. The medication in the patch is effective for 72 hours, after which it should be removed.  Wrap patch in a tissue and discard in the trash. Wash hands thoroughly with soap and water. 2. You may remove the patch earlier than 72 hours if you experience unpleasant side effects which may include dry mouth, dizziness or visual disturbances. 3. Avoid touching the patch. Wash your hands with soap and water after contact with the patch.    POST-OPERATIVE INSTRUCTIONS TO PATIENT  Call [facility] at [phone]  for excessive pain, bleeding or temperature greater than or equal to 100.4 degrees (orally).    No driving for 24 hours  Pain management:  Use over the counter Tylenol or Ibuprofen as needed to maintain a pain level at or below 3/10 Use Colace 1-2 capsules per day as long as you are using pain medication to avoid constipation.       Diet: normal  Bathing: may shower  day after surgery  Return to Dr. Estanislado Pandy as scheduled  Silverio Lay MD

## 2022-12-06 NOTE — Anesthesia Procedure Notes (Signed)
Procedure Name: LMA Insertion Date/Time: 12/06/2022 1:28 PM  Performed by: Charlita Brian D, CRNAPre-anesthesia Checklist: Patient identified, Emergency Drugs available, Suction available and Patient being monitored Patient Re-evaluated:Patient Re-evaluated prior to induction Oxygen Delivery Method: Circle system utilized Preoxygenation: Pre-oxygenation with 100% oxygen Induction Type: IV induction Ventilation: Mask ventilation without difficulty LMA: LMA inserted LMA Size: 4.0 Tube type: Oral Number of attempts: 1 Placement Confirmation: positive ETCO2 and breath sounds checked- equal and bilateral Tube secured with: Tape Dental Injury: Teeth and Oropharynx as per pre-operative assessment

## 2022-12-06 NOTE — Transfer of Care (Signed)
Immediate Anesthesia Transfer of Care Note  Patient: Katherine [name]  Procedure(s) Performed: DILATATION AND CURETTAGE /HYSTEROSCOPY (Uterus) POLYPECTOMY (Uterus) INTRAUTERINE DEVICE (IUD) INSERTION (Uterus)  Patient Location: PACU  Anesthesia Type:General  Level of Consciousness: awake, alert , and oriented  Airway & Oxygen Therapy: Patient Spontanous Breathing and Patient connected to nasal cannula oxygen  Post-op Assessment: Report given to RN and Post -op Vital signs reviewed and stable  Post vital signs: Reviewed and stable  Last Vitals:  Vitals Value Taken Time  BP 144/71 12/06/22 1400  Temp    Pulse 123 12/06/22 1402  Resp 24 12/06/22 1402  SpO2 100 % 12/06/22 1402  Vitals shown include unfiled device data.  Last Pain:  Vitals:   12/06/22 1057  TempSrc: Oral  PainSc: 0-No pain      Patients Stated Pain Goal: 5 (12/06/22 1057)  Complications: No notable events documented.

## 2022-12-06 NOTE — Progress Notes (Signed)
After patient was sure that she would be having surgery. Moved to a bed with monitors attached with O2 on at 2 L/M Versed 2 mg and Fentanyl 25 mcg given to help her relax. See flowsheet for VS.

## 2022-12-06 NOTE — Interval H&P Note (Signed)
History and Physical Interval Note:  12/06/2022 12:06 PM  Katherine [name]  has presented today for surgery, with the diagnosis of ABNORMAL UTERINE BLEEDING ENDOMETRIAL POLYP MORBID OBESITY.  The various methods of treatment have been discussed with the patient and family. After consideration of risks, benefits and other options for treatment, the patient has consented to  Procedure(s): DILATATION AND CURETTAGE /HYSTEROSCOPY (N/A) POLYPECTOMY (N/A) INTRAUTERINE DEVICE (IUD) INSERTION (N/A) as a surgical intervention.  The patient's history has been reviewed, patient examined, no change in status, stable for surgery.  I have reviewed the patient's chart and labs.  Questions were answered to the patient's satisfaction.     Dois Davenport A Zayaan Kozak

## 2022-12-06 NOTE — Op Note (Signed)
Preop diagnosis: Abnormal uterine bleeding, morbid obesity  Postop diagnosis: same  Anesthesia: IV sedation  Anesthesiologist: Dr. Hart Rochester  Procedure: Hysteroscopy, dilatation and curettage with insertion of Mirena IUD for endometrial protection.  Surgeon: Dr. Dois Davenport Natalio Salois  Procedure: After being informed of the planned procedure with possible complications including bleeding, infection and uterine perforation, informed consent was obtained and patient was taken to or #8.  She was given IV sedation anesthesia without complication. She was placed in a dorsal decubitus position, prepped and draped in the sterile fashion and  her bladder was emptied with a red rubber catheter/. Pelvic exam reveals normal anteverted uterus with 2 normal adnexa.  A speculum is inserted in the vagina. The cervix was grasped with a tenaculum forcep placed on the anterior lip.We proceed with a paracervical block using 1% Nesacaine, 10 cc. Uterus is sounded at 8 cm. The cervix is then easily dilated using Hegar dilator until # 25. This allows for easy placement of a diagnostic hysteroscope. With perfusion of Normal Saline at a maximum pressure of 80 mmHg, we are able to evaluate the entire uterine cavity.  Observation: normal appearing endometrium with a thicker posterior wall and 2 tubal ostia.   We then removed our instrumentation. Using a sharp curette, we proceed with curettage of the endometrial cavity which returns a small amount of normal-appearing endometrium.  We insert a Mirena IUD per routine protocol.  Instruments are then removed. Instrument and sponge count is complete x2. Estimated blood loss is minimal. Water deficit is 100 cc of .  The procedure is very well tolerated by the patient who is taken to recovery room in a well and stable condition.  Specimen: Endometrial curettings sent to pathology.

## 2022-12-06 NOTE — Anesthesia Preprocedure Evaluation (Addendum)
Anesthesia Evaluation  Patient identified by MRN, date of birth, ID band Patient awake    Reviewed: Allergy & Precautions, NPO status , Patient's Chart, lab work & pertinent test results  History of Anesthesia Complications (+) PONV and history of anesthetic complications  Airway Mallampati: II  TM Distance: >3 FB     Dental  (+) Teeth Intact, Dental Advisory Given   Pulmonary    breath sounds clear to auscultation       Cardiovascular  Rhythm:Regular Rate:Normal     Neuro/Psych  Headaches PSYCHIATRIC DISORDERS       Neuromuscular disease    GI/Hepatic Neg liver ROS,GERD  ,,  Endo/Other  negative endocrine ROS    Renal/GU negative Renal ROS     Musculoskeletal negative musculoskeletal ROS (+)    Abdominal   Peds  Hematology negative hematology ROS (+)   Anesthesia Other Findings   Reproductive/Obstetrics                             Anesthesia Physical Anesthesia Plan  ASA: 3  Anesthesia Plan: General   Post-op Pain Management: Tylenol PO (pre-op)* and Toradol IV (intra-op)*   Induction: Intravenous  PONV Risk Score and Plan: 4 or greater and Ondansetron, Dexamethasone, Midazolam and Scopolamine patch - Pre-op  Airway Management Planned: LMA and Oral ETT  Additional Equipment: None  Intra-op Plan:   Post-operative Plan: Extubation in OR  Informed Consent: I have reviewed the patients History and Physical, chart, labs and discussed the procedure including the risks, benefits and alternatives for the proposed anesthesia with the patient or authorized representative who has indicated his/her understanding and acceptance.     Dental advisory given  Plan Discussed with: CRNA  Anesthesia Plan Comments:        Anesthesia Quick Evaluation

## 2022-12-10 ENCOUNTER — Encounter (HOSPITAL_BASED_OUTPATIENT_CLINIC_OR_DEPARTMENT_OTHER): Payer: Self-pay | Admitting: Obstetrics and Gynecology

## 2022-12-10 LAB — SURGICAL PATHOLOGY
